# Patient Record
Sex: Male | Born: 2005 | Race: Black or African American | Hispanic: No | Marital: Single | Smoking: Never smoker
Health system: Southern US, Community
[De-identification: ages and names within clinical notes are randomized; demographics above are authoritative.]

---
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
## 2024-02-13 ENCOUNTER — Ambulatory Visit (INDEPENDENT_AMBULATORY_CARE_PROVIDER_SITE_OTHER): Payer: BLUE CROSS/BLUE SHIELD | Admitting: Dermatology

## 2024-02-13 ENCOUNTER — Encounter: Payer: Self-pay | Admitting: Dermatology

## 2024-02-13 DIAGNOSIS — L308 Other specified dermatitis: Secondary | ICD-10-CM

## 2024-02-13 DIAGNOSIS — L209 Atopic dermatitis, unspecified: Secondary | ICD-10-CM | POA: Diagnosis not present

## 2024-02-13 DIAGNOSIS — B360 Pityriasis versicolor: Secondary | ICD-10-CM

## 2024-02-13 DIAGNOSIS — L83 Acanthosis nigricans: Secondary | ICD-10-CM

## 2024-02-13 MED ORDER — KETOCONAZOLE 2 % EX CREA: 1.0000 | TOPICAL_CREAM | Freq: Two times a day (BID) | CUTANEOUS | 2 refills | Status: AC

## 2024-02-13 NOTE — Progress Notes (Unsigned)
   Follow-Up Visit   Subjective  Chase Sanford is a 18 y.o. male who presents for the following: Atopic dermatitis follow up - He has not needed to use TMC cream. He has not had any flares. He was also seen for acanthosis nigracans but he did not use SA wash.    The following portions of the chart were reviewed this encounter and updated as appropriate: medications, allergies, medical history  Review of Systems:  No other skin or systemic complaints except as noted in HPI or Assessment and Plan.  Objective  Well appearing patient in no apparent distress; mood and affect are within normal limits.   A focused examination was performed of the following areas:   Relevant exam findings are noted in the Assessment and Plan.    Assessment & Plan   ATOPIC DERMATITIS Exam: Clear today.   Treatment Plan: Continue Triamcinolone  0.1% cream as needed for flares.  Recommend gentle skin care.   Tinea Versicolor of Neck   Apply Ketoconazole cream twice a day as needed until clear. Advised it can take many months for affected areas to repigment.   Tinea versicolor is a chronic recurrent skin rash causing discolored scaly spots most commonly seen on back, chest, and/or shoulders.  It is generally asymptomatic. The rash is due to overgrowth of a common type of yeast present on everyone's skin and it is not contagious.  It tends to flare more in the summer due to increased sweating on trunk.  After rash is treated, the scaliness will resolve, but the discoloration will take longer to return to normal pigmentation. The periodic use of an OTC medicated soap/shampoo with zinc or selenium sulfide can be helpful to prevent yeast overgrowth and recurrence.  ACNTHOSIS NIGRACANS Exam: Dark velvety patches of neck  Treatment Plan: Recommend La Roche Posay Salicylic Acid wash.  We will order labs today - CBC, CMP and A1C.   ACANTHOSIS NIGRICANS   Related Procedures Hemoglobin A1c CBC  with Differential/Platelets CMP  Return in about 3 months (around 05/15/2024) for Follow up.  I, Eliot Guernsey, CMA, am acting as scribe for [employer], DO .   Documentation: I have reviewed the above documentation for accuracy and completeness, and I agree with the above.  Louana Roup, DO

## 2024-02-13 NOTE — Patient Instructions (Addendum)
 Hello [place],  Thank you for visiting today. Here is a summary of the key instructions:  - Medications:   - Use ketoconazole cream as directed for patch of tinea versicolor on you neck   - Use salicylic acid wash to help fade dark areas on the back of the neck (acanthosis nigricans) and clear the clogged pores on you face  - Lab Tests: (I will send you a MyChart Message with the results)   - Complete blood count (CBC)   - Comprehensive metabolic panel (CMP)   - Hemoglobin A1c test (screening test to see if you're prediabetic)  - Lifestyle Changes:   - Exercise more   - Eat less sugar   - Drink water or sparkling water instead of juices and sodas   - Limit soda intake to one per day if desired  - Follow-up: Return for follow-up appointment in 3 months  Please reach out if you have any questions or concerns.  Warm regards,  Dr. Louana Roup, Dermatology  Important Information  Due to recent changes in healthcare laws, you may see results of your pathology and/or laboratory studies on MyChart before the doctors have had a chance to review them. We understand that in some cases there may be results that are confusing or concerning to you. Please understand that not all results are received at the same time and often the doctors may need to interpret multiple results in order to provide you with the best plan of care or course of treatment. Therefore, we ask that you please give us  2 business days to thoroughly review all your results before contacting the office for clarification. Should we see a critical lab result, you will be contacted sooner.   If You Need Anything After Your Visit  If you have any questions or concerns for your doctor, please call our main line at [phone] If no one answers, please leave a voicemail as directed and we will return your call as soon as possible. Messages left after 4 pm will be answered the following business day.   You may also send us  a  message via MyChart. We typically respond to MyChart messages within 1-2 business days.  For prescription refills, please ask your pharmacy to contact our office. Our fax number is [phone].  If you have an urgent issue when the clinic is closed that cannot wait until the next business day, you can page your doctor at the number below.    Please note that while we do our best to be available for urgent issues outside of office hours, we are not available 24/7.   If you have an urgent issue and are unable to reach us , you may choose to seek medical care at your doctor's office, retail clinic, urgent care center, or emergency room.  If you have a medical emergency, please immediately call 911 or go to the emergency department. In the event of inclement weather, please call our main line at [phone] for an update on the status of any delays or closures.  Dermatology Medication Tips: Please keep the boxes that topical medications come in in order to help keep track of the instructions about where and how to use these. Pharmacies typically print the medication instructions only on the boxes and not directly on the medication tubes.   If your medication is too expensive, please contact our office at [phone] or send us  a message through MyChart.   We are unable to tell what your co-pay for  medications will be in advance as this is different depending on your insurance coverage. However, we may be able to find a substitute medication at lower cost or fill out paperwork to get insurance to cover a needed medication.   If a prior authorization is required to get your medication covered by your insurance company, please allow us  1-2 business days to complete this process.  Drug prices often vary depending on where the prescription is filled and some pharmacies may offer cheaper prices.  The website www.goodrx.com contains coupons for medications through different pharmacies. The prices here  do not account for what the cost may be with help from insurance (it may be cheaper with your insurance), but the website can give you the price if you did not use any insurance.  - You can print the associated coupon and take it with your prescription to the pharmacy.  - You may also stop by our office during regular business hours and pick up a GoodRx coupon card.  - If you need your prescription sent electronically to a different pharmacy, notify our office through [facility] or by phone at [phone]

## 2024-05-09 ENCOUNTER — Ambulatory Visit: Admitting: Dermatology

## 2024-05-09 ENCOUNTER — Encounter: Payer: Self-pay | Admitting: Dermatology

## 2024-05-09 VITALS — BP 121/98

## 2024-05-09 DIAGNOSIS — L308 Other specified dermatitis: Secondary | ICD-10-CM

## 2024-05-09 DIAGNOSIS — L83 Acanthosis nigricans: Secondary | ICD-10-CM | POA: Diagnosis not present

## 2024-05-09 DIAGNOSIS — B360 Pityriasis versicolor: Secondary | ICD-10-CM | POA: Diagnosis not present

## 2024-05-09 DIAGNOSIS — L700 Acne vulgaris: Secondary | ICD-10-CM

## 2024-05-09 DIAGNOSIS — L309 Dermatitis, unspecified: Secondary | ICD-10-CM | POA: Diagnosis not present

## 2024-05-09 DIAGNOSIS — L709 Acne, unspecified: Secondary | ICD-10-CM

## 2024-05-09 NOTE — Progress Notes (Signed)
 Follow-Up Visit   Subjective  Chase Sanford is a 18 y.o. male who presents for the following: Acanthosis Nigracans follow up. He feels it has improved with the sal acid wash. He has not totally cut out sugary drinks but he has cut back. He is trying to walk for about an hour a day. He was also using TMC 0.1% cream for eczema but he says he has not needed to use the cream lately. He was treating tinea versicolor with ketoconazole  cream and it cleared up.   The following portions of the chart were reviewed this encounter and updated as appropriate: medications, allergies, medical history  Review of Systems:  No other skin or systemic complaints except as noted in HPI or Assessment and Plan.  Objective  Well appearing patient in no apparent distress; mood and affect are within normal limits.   A focused examination was performed of the following areas: Chest, back, neck  Relevant exam findings are noted in the Assessment and Plan.    Assessment & Plan    1. Tinea versicolor - Assessment: Patient reports resolution of tinea versicolor with previously prescribed ketoconazole  shampoo and cream. The affected area was under the chest. 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  - Plan:    Continue to use ketoconazole  shampoo and cream as needed for recurrence    Consider prophylactic use of ketoconazole  shampoo once or twice weekly starting in March    Refill ketoconazole  prescription  2. Eczema - Assessment: Patient reports no recent flares of eczema. Previously prescribed triamcinolone  has been effective in managing symptoms.  - Plan:    Continue triamcinolone  as needed for flares    Ensure patient has sufficient supply to take to school  3. Acanthosis nigricans - Assessment: Patient presents with thick skin on the neck and under the arms, consistent with acanthosis nigricans. This condition is associated with insulin  resistance. Patient reports having blood work done at his primary care physician's office, including hemoglobin A1c, with results pending. Patient has increased physical activity, including daily hour-long walks and weight lifting three times per week. Diet modifications include reducing sugar intake, particularly at school.  - Plan:    Await hemoglobin A1c results from primary care physician    Continue current exercise regimen, emphasizing the importance of consistent weight training at least three times per week    Maintain dietary modifications, emphasizing the need for consistent sugar reduction in all settings (school, home, social situations)    Follow up with primary care physician for long-term management of insulin resistance  4. Acne - Assessment: Examination reveals open and closed comedones on the forehead, nose, and cheek, indicating inconsistent facial cleansing routine. Patient's skin shows signs of oil buildup and pore congestion.  - Plan:    Implement twice-daily facial cleansing routine:     - Morning: Wash face with gel or foam cleanser, apply moisturizer     - Evening: Wash face, apply The Ordinary toner, follow with moisturizer    Use The Ordinary toner only at night due to potential for uneven skin tone with sun exposure    Adjust routine in winter by reducing toner use if skin becomes dry    Continue current use of cefazolin (assumed to be for acne management)  Follow-up in one year or sooner if needed.    Return in about 1 year (around 05/09/2025) for Acne, Acanthosis Nigracans.  I, Roseline Hutchinson, CMA, am acting as  scribe for Delon Lenis, DO .   Documentation: I have reviewed the above documentation for accuracy and completeness, and I agree with the above.  Delon Lenis, DO

## 2024-05-09 NOTE — Patient Instructions (Addendum)
 Date: Wed May 09 2024  Hello [place],  Thank you for visiting today. Here is a summary of the key instructions:  - Medications:   - Use ketoconazole  shampoo and cream if tinea versicolor returns   - Continue using triamcinolone  for eczema as needed   - Use refills for medications as needed  - Skin Care Routine:   - Morning:     - Wash face with Khiels Cleanser     - Apply Khiels moisturizer   - Night:     - Wash face Khiels Cleanser     - Use The Ordinary toner     - Apply moisturizer   - Use a gel or foam cleanser to control oil   - In winter, stop using toner if skin becomes dry  - Lifestyle Changes:   - Continue walking for an hour every day   - Lift weights at least 3 times a week   - Cut out sugar from diet at school, home, and when out with friends  - Follow-up:   - Return for dermatology appointment in 1 year   - You will see your primary care doctor on Friday for A1c results  Please reach out if you have any questions or concerns.  Warm regards,  Dr. Delon Lenis, Dermatology   Important Information  Due to recent changes in healthcare laws, you may see results of your pathology and/or laboratory studies on MyChart before the doctors have had a chance to review them. We understand that in some cases there may be results that are confusing or concerning to you. Please understand that not all results are received at the same time and often the doctors may need to interpret multiple results in order to provide you with the best plan of care or course of treatment. Therefore, we ask that you please give us  2 business days to thoroughly review all your results before contacting the office for clarification. Should we see a critical lab result, you will be contacted sooner.   If You Need Anything After Your Visit  If you have any questions or concerns for your doctor, please call our main line at [phone] If no one answers, please leave a voicemail as directed and  we will return your call as soon as possible. Messages left after 4 pm will be answered the following business day.   You may also send us  a message via MyChart. We typically respond to MyChart messages within 1-2 business days.  For prescription refills, please ask your pharmacy to contact our office. Our fax number is [phone].  If you have an urgent issue when the clinic is closed that cannot wait until the next business day, you can page your doctor at the number below.    Please note that while we do our best to be available for urgent issues outside of office hours, we are not available 24/7.   If you have an urgent issue and are unable to reach us , you may choose to seek medical care at your doctor's office, retail clinic, urgent care center, or emergency room.  If you have a medical emergency, please immediately call 911 or go to the emergency department. In the event of inclement weather, please call our main line at [phone] for an update on the status of any delays or closures.  Dermatology Medication Tips: Please keep the boxes that topical medications come in in order to help keep track of the instructions about where and how  to use these. Pharmacies typically print the medication instructions only on the boxes and not directly on the medication tubes.   If your medication is too expensive, please contact our office at [phone] or send us  a message through MyChart.   We are unable to tell what your co-pay for medications will be in advance as this is different depending on your insurance coverage. However, we may be able to find a substitute medication at lower cost or fill out paperwork to get insurance to cover a needed medication.   If a prior authorization is required to get your medication covered by your insurance company, please allow us  1-2 business days to complete this process.  Drug prices often vary depending on where the prescription is filled and some  pharmacies may offer cheaper prices.  The website www.goodrx.com contains coupons for medications through different pharmacies. The prices here do not account for what the cost may be with help from insurance (it may be cheaper with your insurance), but the website can give you the price if you did not use any insurance.  - You can print the associated coupon and take it with your prescription to the pharmacy.  - You may also stop by our office during regular business hours and pick up a GoodRx coupon card.  - If you need your prescription sent electronically to a different pharmacy, notify our office through [facility] or by phone at [phone]

## 2025-02-11 ENCOUNTER — Ambulatory Visit: Admitting: Dermatology
